# Patient Record
Sex: Male | Born: 1980 | Race: White | Hispanic: No | Marital: Single | Smoking: Never smoker
Health system: Southern US, Community
[De-identification: ages and names within clinical notes are randomized; demographics above are authoritative.]

## PROBLEM LIST (undated history)

## (undated) DIAGNOSIS — M1990 Unspecified osteoarthritis, unspecified site: Secondary | ICD-10-CM

## (undated) DIAGNOSIS — F419 Anxiety disorder, unspecified: Secondary | ICD-10-CM

## (undated) DIAGNOSIS — F329 Major depressive disorder, single episode, unspecified: Secondary | ICD-10-CM

## (undated) DIAGNOSIS — F32A Depression, unspecified: Secondary | ICD-10-CM

## (undated) DIAGNOSIS — K219 Gastro-esophageal reflux disease without esophagitis: Secondary | ICD-10-CM

## (undated) HISTORY — DX: Depression, unspecified: F32.A

## (undated) HISTORY — DX: Major depressive disorder, single episode, unspecified: F32.9

## (undated) HISTORY — DX: Anxiety disorder, unspecified: F41.9

---

## 2006-11-08 HISTORY — PX: HAND SURGERY: SHX662

## 2008-03-18 ENCOUNTER — Ambulatory Visit (HOSPITAL_COMMUNITY): Admission: RE | Admit: 2008-03-18 | Discharge: 2008-03-18 | Payer: Self-pay | Admitting: Family Medicine

## 2008-04-02 ENCOUNTER — Ambulatory Visit (HOSPITAL_COMMUNITY): Admission: RE | Admit: 2008-04-02 | Discharge: 2008-04-02 | Payer: Self-pay | Admitting: Family Medicine

## 2017-12-06 ENCOUNTER — Encounter: Payer: Self-pay | Admitting: Gastroenterology

## 2017-12-27 ENCOUNTER — Emergency Department (HOSPITAL_COMMUNITY): Payer: Medicaid - Out of State

## 2017-12-27 ENCOUNTER — Emergency Department (HOSPITAL_COMMUNITY)
Admission: EM | Admit: 2017-12-27 | Discharge: 2017-12-27 | Disposition: A | Discharge status: Home / Self Care | Payer: Medicaid - Out of State | Attending: Emergency Medicine | Admitting: Emergency Medicine

## 2017-12-27 ENCOUNTER — Encounter (HOSPITAL_COMMUNITY): Payer: Self-pay | Admitting: Emergency Medicine

## 2017-12-27 DIAGNOSIS — R0789 Other chest pain: Secondary | ICD-10-CM | POA: Diagnosis not present

## 2017-12-27 DIAGNOSIS — Y9389 Activity, other specified: Secondary | ICD-10-CM | POA: Diagnosis not present

## 2017-12-27 DIAGNOSIS — R51 Headache: Secondary | ICD-10-CM | POA: Insufficient documentation

## 2017-12-27 DIAGNOSIS — R109 Unspecified abdominal pain: Secondary | ICD-10-CM | POA: Insufficient documentation

## 2017-12-27 DIAGNOSIS — M542 Cervicalgia: Secondary | ICD-10-CM | POA: Insufficient documentation

## 2017-12-27 DIAGNOSIS — M7918 Myalgia, other site: Secondary | ICD-10-CM | POA: Diagnosis not present

## 2017-12-27 DIAGNOSIS — R079 Chest pain, unspecified: Secondary | ICD-10-CM | POA: Diagnosis present

## 2017-12-27 DIAGNOSIS — R55 Syncope and collapse: Secondary | ICD-10-CM | POA: Diagnosis not present

## 2017-12-27 DIAGNOSIS — Y998 Other external cause status: Secondary | ICD-10-CM | POA: Diagnosis not present

## 2017-12-27 DIAGNOSIS — Y92410 Unspecified street and highway as the place of occurrence of the external cause: Secondary | ICD-10-CM | POA: Insufficient documentation

## 2017-12-27 LAB — COMPREHENSIVE METABOLIC PANEL
ALT: 40 U/L (ref 17–63)
AST: 24 U/L (ref 15–41)
Albumin: 4 g/dL (ref 3.5–5.0)
Alkaline Phosphatase: 55 U/L (ref 38–126)
Anion gap: 12 (ref 5–15)
BILIRUBIN TOTAL: 0.4 mg/dL (ref 0.3–1.2)
BUN: 11 mg/dL (ref 6–20)
CALCIUM: 9.5 mg/dL (ref 8.9–10.3)
CHLORIDE: 106 mmol/L (ref 101–111)
CO2: 24 mmol/L (ref 22–32)
CREATININE: 0.9 mg/dL (ref 0.61–1.24)
Glucose, Bld: 120 mg/dL — ABNORMAL HIGH (ref 65–99)
Potassium: 3.4 mmol/L — ABNORMAL LOW (ref 3.5–5.1)
Sodium: 142 mmol/L (ref 135–145)
TOTAL PROTEIN: 7.7 g/dL (ref 6.5–8.1)

## 2017-12-27 LAB — TYPE AND SCREEN
ABO/RH(D): B POS
ANTIBODY SCREEN: NEGATIVE

## 2017-12-27 LAB — CBC WITH DIFFERENTIAL/PLATELET
Basophils Absolute: 0 10*3/uL (ref 0.0–0.1)
Basophils Relative: 0 %
EOS PCT: 0 %
Eosinophils Absolute: 0 10*3/uL (ref 0.0–0.7)
HEMATOCRIT: 45.6 % (ref 39.0–52.0)
Hemoglobin: 15 g/dL (ref 13.0–17.0)
LYMPHS ABS: 1.5 10*3/uL (ref 0.7–4.0)
LYMPHS PCT: 11 %
MCH: 28 pg (ref 26.0–34.0)
MCHC: 32.9 g/dL (ref 30.0–36.0)
MCV: 85.1 fL (ref 78.0–100.0)
MONO ABS: 0.7 10*3/uL (ref 0.1–1.0)
Monocytes Relative: 5 %
NEUTROS ABS: 11.4 10*3/uL — AB (ref 1.7–7.7)
Neutrophils Relative %: 84 %
Platelets: 256 10*3/uL (ref 150–400)
RBC: 5.36 MIL/uL (ref 4.22–5.81)
RDW: 14.4 % (ref 11.5–15.5)
WBC: 13.7 10*3/uL — AB (ref 4.0–10.5)

## 2017-12-27 LAB — URINALYSIS, ROUTINE W REFLEX MICROSCOPIC
Bacteria, UA: NONE SEEN
Bilirubin Urine: NEGATIVE
Glucose, UA: NEGATIVE mg/dL
Ketones, ur: NEGATIVE mg/dL
Leukocytes, UA: NEGATIVE
NITRITE: NEGATIVE
PH: 7 (ref 5.0–8.0)
Protein, ur: NEGATIVE mg/dL
SQUAMOUS EPITHELIAL / LPF: NONE SEEN
Specific Gravity, Urine: 1.005 (ref 1.005–1.030)
WBC UA: NONE SEEN WBC/hpf (ref 0–5)

## 2017-12-27 MED ORDER — HYDROCODONE-ACETAMINOPHEN 5-325 MG PO TABS
1.0000 | ORAL_TABLET | Freq: Once | ORAL | Status: AC
  Administered 2017-12-27: 1 via ORAL
  Filled 2017-12-27: qty 1

## 2017-12-27 MED ORDER — HYDROCODONE-ACETAMINOPHEN 5-325 MG PO TABS: 1.0000 | ORAL_TABLET | ORAL | 0 refills | Status: DC | PRN

## 2017-12-27 MED ORDER — IBUPROFEN 800 MG PO TABS: 800.0000 mg | ORAL_TABLET | Freq: Three times a day (TID) | ORAL | 0 refills | Status: DC

## 2017-12-27 MED ORDER — CYCLOBENZAPRINE HCL 5 MG PO TABS: 5.0000 mg | ORAL_TABLET | Freq: Three times a day (TID) | ORAL | 0 refills | Status: DC | PRN

## 2017-12-27 MED ORDER — KETOROLAC TROMETHAMINE 30 MG/ML IJ SOLN
30.0000 mg | Freq: Once | INTRAMUSCULAR | Status: AC
  Administered 2017-12-27: 30 mg via INTRAVENOUS
  Filled 2017-12-27: qty 1

## 2017-12-27 MED ORDER — IOPAMIDOL (ISOVUE-300) INJECTION 61%
100.0000 mL | Freq: Once | INTRAVENOUS | Status: AC | PRN
  Administered 2017-12-27: 100 mL via INTRAVENOUS
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
## 2017-12-27 NOTE — ED Notes (Signed)
Pt stated he needs to urinate badly. Attempted to help pt x 2 and ran water but pt states it will not come out.
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
## 2018-01-20 ENCOUNTER — Telehealth: Payer: Self-pay | Admitting: *Deleted

## 2018-01-20 ENCOUNTER — Other Ambulatory Visit: Payer: Self-pay | Admitting: *Deleted

## 2018-01-20 ENCOUNTER — Encounter: Payer: Self-pay | Admitting: Gastroenterology

## 2018-01-20 ENCOUNTER — Encounter: Payer: Self-pay | Admitting: *Deleted

## 2018-01-20 ENCOUNTER — Ambulatory Visit (INDEPENDENT_AMBULATORY_CARE_PROVIDER_SITE_OTHER): Payer: Medicaid - Out of State | Admitting: Gastroenterology

## 2018-01-20 VITALS — BP 135/87 | HR 82 | Temp 97.2°F | Ht 67.5 in | Wt 173.6 lb

## 2018-01-20 DIAGNOSIS — R1030 Lower abdominal pain, unspecified: Secondary | ICD-10-CM | POA: Diagnosis not present

## 2018-01-20 DIAGNOSIS — R634 Abnormal weight loss: Secondary | ICD-10-CM | POA: Diagnosis not present

## 2018-01-20 DIAGNOSIS — K529 Noninfective gastroenteritis and colitis, unspecified: Secondary | ICD-10-CM | POA: Diagnosis not present

## 2018-01-20 DIAGNOSIS — K625 Hemorrhage of anus and rectum: Secondary | ICD-10-CM

## 2018-01-20 DIAGNOSIS — K920 Hematemesis: Secondary | ICD-10-CM

## 2018-01-20 DIAGNOSIS — R109 Unspecified abdominal pain: Secondary | ICD-10-CM | POA: Insufficient documentation

## 2018-01-20 MED ORDER — NA SULFATE-K SULFATE-MG SULF 17.5-3.13-1.6 GM/177ML PO SOLN: 1.0000 | ORAL | 0 refills | Status: AC

## 2018-01-20 MED ORDER — PANTOPRAZOLE SODIUM 40 MG PO TBEC: 40.0000 mg | DELAYED_RELEASE_TABLET | Freq: Every day | ORAL | 5 refills | Status: AC

## 2018-01-20 MED ORDER — PEG 3350-KCL-NA BICARB-NACL 420 G PO SOLR: 4000.0000 mL | Freq: Once | ORAL | 0 refills | Status: AC
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
## 2018-01-20 NOTE — Patient Instructions (Signed)
1. Start pantoprazole for acid reflux, possible gastritis or ulcer. Take one daily before breakfast.  2. Upper endoscopy and colonoscopy as scheduled. See separate instructions.

## 2018-01-20 NOTE — Telephone Encounter (Signed)
Received fax from the pharmacy suprep not covered. [email or web address] sent in and patient aware new instructions mailed to him (confirmed mailing address). Patient also aware of pre-op scheduled for 02/15/18 at 12:45pm. Letter also mailed.

## 2018-01-23 NOTE — Progress Notes (Signed)
CC'D TO PCP °

## 2018-02-14 NOTE — Patient Instructions (Signed)
Richard Flores  02/14/2018     @PREFPERIOPPHARMACY @   Your procedure is scheduled on  02/20/2018 .  Report to [facility] at  1000   A.M.  Call this number if you have problems the morning of surgery:  [phone]   Remember:  Do not eat food or drink liquids after midnight.  Take these medicines the morning of surgery with A SIP OF WATER  protonix.   Do not wear jewelry, make-up or nail polish.  Do not wear lotions, powders, or perfumes, or deodorant.  Do not shave 48 hours prior to surgery.  Men may shave face and neck.  Do not bring valuables to the hospital.  [facility] is not responsible for any belongings or valuables.  Contacts, dentures or bridgework may not be worn into surgery.  Leave your suitcase in the car.  After surgery it may be brought to your room.  For patients admitted to the hospital, discharge time will be determined by your treatment team.  Patients discharged the day of surgery will not be allowed to drive home.   Name and phone number of your driver:   family Special instructions:  Follow the diet and prep instructions given to you by Dr Roseanne Kaufman office.  Please read over the following fact sheets that you were given. Anesthesia Post-op Instructions and Care and Recovery After Surgery       Esophagogastroduodenoscopy Esophagogastroduodenoscopy (EGD) is a procedure to examine the lining of the esophagus, stomach, and first part of the small intestine (duodenum). This procedure is done to check for problems such as inflammation, bleeding, ulcers, or growths. During this procedure, a long, flexible, lighted tube with a camera attached (endoscope) is inserted down the throat. Tell a health care provider about:  Any allergies you have.  All medicines you are taking, including vitamins, herbs, eye drops, creams, and over-the-counter medicines.  Any problems you or family members have had with anesthetic medicines.  Any blood  disorders you have.  Any surgeries you have had.  Any medical conditions you have.  Whether you are pregnant or may be pregnant. What are the risks? Generally, this is a safe procedure. However, problems may occur, including:  Infection.  Bleeding.  A tear (perforation) in the esophagus, stomach, or duodenum.  Trouble breathing.  Excessive sweating.  Spasms of the larynx.  A slowed heartbeat.  Low blood pressure.  What happens before the procedure?  Follow instructions from your health care provider about eating or drinking restrictions.  Ask your health care provider about: ? Changing or stopping your regular medicines. This is especially important if you are taking diabetes medicines or blood thinners. ? Taking medicines such as aspirin and ibuprofen. These medicines can thin your blood. Do not take these medicines before your procedure if your health care provider instructs you not to.  Plan to have someone take you home after the procedure.  If you wear dentures, be ready to remove them before the procedure. What happens during the procedure?  To reduce your risk of infection, your health care team will wash or sanitize their hands.  An IV tube will be put in a vein in your hand or arm. You will get medicines and fluids through this tube.  You will be given one or more of the following: ? A medicine to help you relax (sedative). ? A medicine to numb the area (local anesthetic). This  medicine may be sprayed into your throat. It will make you feel more comfortable and keep you from gagging or coughing during the procedure. ? A medicine for pain.  A mouth guard may be placed in your mouth to protect your teeth and to keep you from biting on the endoscope.  You will be asked to lie on your left side.  The endoscope will be lowered down your throat into your esophagus, stomach, and duodenum.  Air will be put into the endoscope. This will help your health care  provider see better.  The lining of your esophagus, stomach, and duodenum will be examined.  Your health care provider may: ? Take a tissue sample so it can be looked at in a lab (biopsy). ? Remove growths. ? Remove objects (foreign bodies) that are stuck. ? Treat any bleeding with medicines or other devices that stop tissue from bleeding. ? Widen (dilate) or stretch narrowed areas of your esophagus and stomach.  The endoscope will be taken out. The procedure may vary among health care providers and hospitals. What happens after the procedure?  Your blood pressure, heart rate, breathing rate, and blood oxygen level will be monitored often until the medicines you were given have worn off.  Do not eat or drink anything until the numbing medicine has worn off and your gag reflex has returned. This information is not intended to replace advice given to you by your health care provider. Make sure you discuss any questions you have with your health care provider. Document Released: 02/25/2005 Document Revised: 04/01/2016 Document Reviewed: 09/18/2015 Elsevier Interactive Patient Education  2018 [employer]. Esophagogastroduodenoscopy, Care After Refer to this sheet in the next few weeks. These instructions provide you with information about caring for yourself after your procedure. Your health care provider may also give you more specific instructions. Your treatment has been planned according to current medical practices, but problems sometimes occur. Call your health care provider if you have any problems or questions after your procedure. What can I expect after the procedure? After the procedure, it is common to have:  A sore throat.  Nausea.  Bloating.  Dizziness.  Fatigue.  Follow these instructions at home:  Do not eat or drink anything until the numbing medicine (local anesthetic) has worn off and your gag reflex has returned. You will know that the local anesthetic has worn  off when you can swallow comfortably.  Do not drive for 24 hours if you received a medicine to help you relax (sedative).  If your health care provider took a tissue sample for testing during the procedure, make sure to get your test results. This is your responsibility. Ask your health care provider or the department performing the test when your results will be ready.  Keep all follow-up visits as told by your health care provider. This is important. Contact a health care provider if:  You cannot stop coughing.  You are not urinating.  You are urinating less than usual. Get help right away if:  You have trouble swallowing.  You cannot eat or drink.  You have throat or chest pain that gets worse.  You are dizzy or light-headed.  You faint.  You have nausea or vomiting.  You have chills.  You have a fever.  You have severe abdominal pain.  You have black, tarry, or bloody stools. This information is not intended to replace advice given to you by your health care provider. Make sure you discuss any questions you  have with your health care provider. Document Released: 10/11/2012 Document Revised: 04/01/2016 Document Reviewed: 09/18/2015 Elsevier Interactive Patient Education  2018 [employer].  Colonoscopy, Adult A colonoscopy is an exam to look at the large intestine. It is done to check for problems, such as:  Lumps (tumors).  Growths (polyps).  Swelling (inflammation).  Bleeding.  What happens before the procedure? Eating and drinking Follow instructions from your doctor about eating and drinking. These instructions may include:  A few days before the procedure - follow a low-fiber diet. ? Avoid nuts. ? Avoid seeds. ? Avoid dried fruit. ? Avoid raw fruits. ? Avoid vegetables.  1-3 days before the procedure - follow a clear liquid diet. Avoid liquids that have red or purple dye. Drink only clear liquids, such as: ? Clear broth or bouillon. ? Black  coffee or tea. ? Clear juice. ? Clear soft drinks or sports drinks. ? Gelatin dessert. ? Popsicles.  On the day of the procedure - do not eat or drink anything during the 2 hours before the procedure.  Bowel prep If you were prescribed an oral bowel prep:  Take it as told by your doctor. Starting the day before your procedure, you will need to drink a lot of liquid. The liquid will cause you to poop (have bowel movements) until your poop is almost clear or light green.  If your skin or butt gets irritated from diarrhea, you may: ? Wipe the area with wipes that have medicine in them, such as adult wet wipes with aloe and vitamin E. ? Put something on your skin that soothes the area, such as petroleum jelly.  If you throw up (vomit) while drinking the bowel prep, take a break for up to 60 minutes. Then begin the bowel prep again. If you keep throwing up and you cannot take the bowel prep without throwing up, call your doctor.  General instructions  Ask your doctor about changing or stopping your normal medicines. This is important if you take diabetes medicines or blood thinners.  Plan to have someone take you home from the hospital or clinic. What happens during the procedure?  An IV tube may be put into one of your veins.  You will be given medicine to help you relax (sedative).  To reduce your risk of infection: ? Your doctors will wash their hands. ? Your anal area will be washed with soap.  You will be asked to lie on your side with your knees bent.  Your doctor will get a long, thin, flexible tube ready. The tube will have a camera and a light on the end.  The tube will be put into your anus.  The tube will be gently put into your large intestine.  Air will be delivered into your large intestine to keep it open. You may feel some pressure or cramping.  The camera will be used to take photos.  A small tissue sample may be removed from your body to be looked at under a  microscope (biopsy). If any possible problems are found, the tissue will be sent to a lab for testing.  If small growths are found, your doctor may remove them and have them checked for cancer.  The tube that was put into your anus will be slowly removed. The procedure may vary among doctors and hospitals. What happens after the procedure?  Your doctor will check on you often until the medicines you were given have worn off.  Do not drive for  24 hours after the procedure.  You may have a small amount of blood in your poop.  You may pass gas.  You may have mild cramps or bloating in your belly (abdomen).  It is up to you to get the results of your procedure. Ask your doctor, or the department performing the procedure, when your results will be ready. This information is not intended to replace advice given to you by your health care provider. Make sure you discuss any questions you have with your health care provider. Document Released: 11/27/2010 Document Revised: 08/25/2016 Document Reviewed: 01/06/2016 Elsevier Interactive Patient Education  2017 [employer]  Colonoscopy, Adult, Care After This sheet gives you information about how to care for yourself after your procedure. Your health care provider may also give you more specific instructions. If you have problems or questions, contact your health care provider. What can I expect after the procedure? After the procedure, it is common to have:  A small amount of blood in your stool for 24 hours after the procedure.  Some gas.  Mild abdominal cramping or bloating.  Follow these instructions at home: General instructions   For the first 24 hours after the procedure: ? Do not drive or use machinery. ? Do not sign important documents. ? Do not drink alcohol. ? Do your regular daily activities at a slower pace than normal. ? Eat soft, easy-to-digest foods. ? Rest often.  Take over-the-counter or prescription medicines  only as told by your health care provider.  It is up to you to get the results of your procedure. Ask your health care provider, or the department performing the procedure, when your results will be ready. Relieving cramping and bloating  Try walking around when you have cramps or feel bloated.  Apply heat to your abdomen as told by your health care provider. Use a heat source that your health care provider recommends, such as a moist heat pack or a heating pad. ? Place a towel between your skin and the heat source. ? Leave the heat on for 20-30 minutes. ? Remove the heat if your skin turns bright red. This is especially important if you are unable to feel pain, heat, or cold. You may have a greater risk of getting burned. Eating and drinking  Drink enough fluid to keep your urine clear or pale yellow.  Resume your normal diet as instructed by your health care provider. Avoid heavy or fried foods that are hard to digest.  Avoid drinking alcohol for as long as instructed by your health care provider. Contact a health care provider if:  You have blood in your stool 2-3 days after the procedure. Get help right away if:  You have more than a small spotting of blood in your stool.  You pass large blood clots in your stool.  Your abdomen is swollen.  You have nausea or vomiting.  You have a fever.  You have increasing abdominal pain that is not relieved with medicine. This information is not intended to replace advice given to you by your health care provider. Make sure you discuss any questions you have with your health care provider. Document Released: 06/08/2004 Document Revised: 07/19/2016 Document Reviewed: 01/06/2016 Elsevier Interactive Patient Education  2018 [place] Anesthesia is a term that refers to techniques, procedures, and medicines that help a person stay safe and comfortable during a medical procedure. Monitored anesthesia care, or  sedation, is one type of anesthesia. Your anesthesia specialist  may recommend sedation if you will be having a procedure that does not require you to be unconscious, such as:  Cataract surgery.  A dental procedure.  A biopsy.  A colonoscopy.  During the procedure, you may receive a medicine to help you relax (sedative). There are three levels of sedation:  Mild sedation. At this level, you may feel awake and relaxed. You will be able to follow directions.  Moderate sedation. At this level, you will be sleepy. You may not remember the procedure.  Deep sedation. At this level, you will be asleep. You will not remember the procedure.  The more medicine you are given, the deeper your level of sedation will be. Depending on how you respond to the procedure, the anesthesia specialist may change your level of sedation or the type of anesthesia to fit your needs. An anesthesia specialist will monitor you closely during the procedure. Let your health care provider know about:  Any allergies you have.  All medicines you are taking, including vitamins, herbs, eye drops, creams, and over-the-counter medicines.  Any use of steroids (by mouth or as a cream).  Any problems you or family members have had with sedatives and anesthetic medicines.  Any blood disorders you have.  Any surgeries you have had.  Any medical conditions you have, such as sleep apnea.  Whether you are pregnant or may be pregnant.  Any use of cigarettes, alcohol, or street drugs. What are the risks? Generally, this is a safe procedure. However, problems may occur, including:  Getting too much medicine (oversedation).  Nausea.  Allergic reaction to medicines.  Trouble breathing. If this happens, a breathing tube may be used to help with breathing. It will be removed when you are awake and breathing on your own.  Heart trouble.  Lung trouble.  Before the procedure Staying hydrated Follow instructions from  your health care provider about hydration, which may include:  Up to 2 hours before the procedure - you may continue to drink clear liquids, such as water, clear fruit juice, black coffee, and plain tea.  Eating and drinking restrictions Follow instructions from your health care provider about eating and drinking, which may include:  8 hours before the procedure - stop eating heavy meals or foods such as meat, fried foods, or fatty foods.  6 hours before the procedure - stop eating light meals or foods, such as toast or cereal.  6 hours before the procedure - stop drinking milk or drinks that contain milk.  2 hours before the procedure - stop drinking clear liquids.  Medicines Ask your health care provider about:  Changing or stopping your regular medicines. This is especially important if you are taking diabetes medicines or blood thinners.  Taking medicines such as aspirin and ibuprofen. These medicines can thin your blood. Do not take these medicines before your procedure if your health care provider instructs you not to.  Tests and exams  You will have a physical exam.  You may have blood tests done to show: ? How well your kidneys and liver are working. ? How well your blood can clot.  General instructions  Plan to have someone take you home from the hospital or clinic.  If you will be going home right after the procedure, plan to have someone with you for 24 hours.  What happens during the procedure?  Your blood pressure, heart rate, breathing, level of pain and overall condition will be monitored.  An IV tube will be inserted  into one of your veins.  Your anesthesia specialist will give you medicines as needed to keep you comfortable during the procedure. This may mean changing the level of sedation.  The procedure will be performed. After the procedure  Your blood pressure, heart rate, breathing rate, and blood oxygen level will be monitored until the medicines  you were given have worn off.  Do not drive for 24 hours if you received a sedative.  You may: ? Feel sleepy, clumsy, or nauseous. ? Feel forgetful about what happened after the procedure. ? Have a sore throat if you had a breathing tube during the procedure. ? Vomit. This information is not intended to replace advice given to you by your health care provider. Make sure you discuss any questions you have with your health care provider. Document Released: 07/21/2005 Document Revised: 04/02/2016 Document Reviewed: 02/15/2016 Elsevier Interactive Patient Education  2018 [place], Care After These instructions provide you with information about caring for yourself after your procedure. Your health care provider may also give you more specific instructions. Your treatment has been planned according to current medical practices, but problems sometimes occur. Call your health care provider if you have any problems or questions after your procedure. What can I expect after the procedure? After your procedure, it is common to:  Feel sleepy for several hours.  Feel clumsy and have poor balance for several hours.  Feel forgetful about what happened after the procedure.  Have poor judgment for several hours.  Feel nauseous or vomit.  Have a sore throat if you had a breathing tube during the procedure.  Follow these instructions at home: For at least 24 hours after the procedure:   Do not: ? Participate in activities in which you could fall or become injured. ? Drive. ? Use heavy machinery. ? Drink alcohol. ? Take sleeping pills or medicines that cause drowsiness. ? Make important decisions or sign legal documents. ? Take care of children on your own.  Rest. Eating and drinking  Follow the diet that is recommended by your health care provider.  If you vomit, drink water, juice, or soup when you can drink without vomiting.  Make sure you have little  or no nausea before eating solid foods. General instructions  Have a responsible adult stay with you until you are awake and alert.  Take over-the-counter and prescription medicines only as told by your health care provider.  If you smoke, do not smoke without supervision.  Keep all follow-up visits as told by your health care provider. This is important. Contact a health care provider if:  You keep feeling nauseous or you keep vomiting.  You feel light-headed.  You develop a rash.  You have a fever. Get help right away if:  You have trouble breathing. This information is not intended to replace advice given to you by your health care provider. Make sure you discuss any questions you have with your health care provider. Document Released: 02/15/2016 Document Revised: 06/16/2016 Document Reviewed: 02/15/2016 Elsevier Interactive Patient Education  [employer].

## 2018-02-15 ENCOUNTER — Other Ambulatory Visit: Payer: Self-pay

## 2018-02-15 ENCOUNTER — Encounter (HOSPITAL_COMMUNITY)
Admission: RE | Admit: 2018-02-15 | Discharge: 2018-02-15 | Disposition: A | Discharge status: Home / Self Care | Payer: Medicaid - Out of State | Source: Ambulatory Visit | Attending: Internal Medicine | Admitting: Internal Medicine

## 2018-02-15 ENCOUNTER — Encounter (HOSPITAL_COMMUNITY): Payer: Self-pay

## 2018-02-15 DIAGNOSIS — Z01812 Encounter for preprocedural laboratory examination: Secondary | ICD-10-CM | POA: Diagnosis not present

## 2018-02-15 HISTORY — DX: Gastro-esophageal reflux disease without esophagitis: K21.9

## 2018-02-15 HISTORY — DX: Unspecified osteoarthritis, unspecified site: M19.90

## 2018-02-15 LAB — BASIC METABOLIC PANEL
ANION GAP: 13 (ref 5–15)
BUN: 15 mg/dL (ref 6–20)
CHLORIDE: 103 mmol/L (ref 101–111)
CO2: 24 mmol/L (ref 22–32)
Calcium: 9.8 mg/dL (ref 8.9–10.3)
Creatinine, Ser: 1 mg/dL (ref 0.61–1.24)
GFR calc non Af Amer: >60 mL/min (ref >60)
Glucose, Bld: 121 mg/dL — ABNORMAL HIGH (ref 65–99)
POTASSIUM: 3.9 mmol/L (ref 3.5–5.1)
Sodium: 140 mmol/L (ref 135–145)

## 2018-02-15 LAB — CBC WITH DIFFERENTIAL/PLATELET
BASOS ABS: 0 10*3/uL (ref 0.0–0.1)
BASOS PCT: 0 %
EOS ABS: 0.1 10*3/uL (ref 0.0–0.7)
EOS PCT: 1 %
HCT: 46.8 % (ref 39.0–52.0)
HEMOGLOBIN: 15.6 g/dL (ref 13.0–17.0)
Lymphocytes Relative: 17 %
Lymphs Abs: 1.4 10*3/uL (ref 0.7–4.0)
MCH: 28.3 pg (ref 26.0–34.0)
MCHC: 33.3 g/dL (ref 30.0–36.0)
MCV: 84.9 fL (ref 78.0–100.0)
Monocytes Absolute: 0.5 10*3/uL (ref 0.1–1.0)
Monocytes Relative: 6 %
NEUTROS PCT: 76 %
Neutro Abs: 6.3 10*3/uL (ref 1.7–7.7)
PLATELETS: 209 10*3/uL (ref 150–400)
RBC: 5.51 MIL/uL (ref 4.22–5.81)
RDW: 13.7 % (ref 11.5–15.5)
WBC: 8.2 10*3/uL (ref 4.0–10.5)

## 2018-02-20 ENCOUNTER — Ambulatory Visit (HOSPITAL_COMMUNITY)
Admission: RE | Admit: 2018-02-20 | Discharge: 2018-02-20 | Disposition: A | Discharge status: Home / Self Care | Payer: Medicaid - Out of State | Source: Ambulatory Visit | Attending: Internal Medicine | Admitting: Internal Medicine

## 2018-02-20 ENCOUNTER — Encounter (HOSPITAL_COMMUNITY)
Admission: RE | Disposition: A | Discharge status: Home / Self Care | Payer: Self-pay | Source: Ambulatory Visit | Attending: Internal Medicine

## 2018-02-20 ENCOUNTER — Other Ambulatory Visit: Payer: Self-pay

## 2018-02-20 ENCOUNTER — Ambulatory Visit (HOSPITAL_COMMUNITY): Payer: Medicaid - Out of State | Admitting: Anesthesiology

## 2018-02-20 ENCOUNTER — Encounter (HOSPITAL_COMMUNITY): Payer: Self-pay | Admitting: Anesthesiology

## 2018-02-20 DIAGNOSIS — K449 Diaphragmatic hernia without obstruction or gangrene: Secondary | ICD-10-CM | POA: Insufficient documentation

## 2018-02-20 DIAGNOSIS — R1310 Dysphagia, unspecified: Secondary | ICD-10-CM | POA: Diagnosis not present

## 2018-02-20 DIAGNOSIS — K921 Melena: Secondary | ICD-10-CM | POA: Diagnosis not present

## 2018-02-20 DIAGNOSIS — K529 Noninfective gastroenteritis and colitis, unspecified: Secondary | ICD-10-CM | POA: Diagnosis not present

## 2018-02-20 DIAGNOSIS — R112 Nausea with vomiting, unspecified: Secondary | ICD-10-CM | POA: Diagnosis not present

## 2018-02-20 DIAGNOSIS — K319 Disease of stomach and duodenum, unspecified: Secondary | ICD-10-CM | POA: Insufficient documentation

## 2018-02-20 DIAGNOSIS — R1013 Epigastric pain: Secondary | ICD-10-CM

## 2018-02-20 DIAGNOSIS — K635 Polyp of colon: Secondary | ICD-10-CM | POA: Insufficient documentation

## 2018-02-20 DIAGNOSIS — K640 First degree hemorrhoids: Secondary | ICD-10-CM | POA: Diagnosis not present

## 2018-02-20 DIAGNOSIS — K269 Duodenal ulcer, unspecified as acute or chronic, without hemorrhage or perforation: Secondary | ICD-10-CM | POA: Insufficient documentation

## 2018-02-20 DIAGNOSIS — Z79899 Other long term (current) drug therapy: Secondary | ICD-10-CM | POA: Diagnosis not present

## 2018-02-20 DIAGNOSIS — D120 Benign neoplasm of cecum: Secondary | ICD-10-CM

## 2018-02-20 DIAGNOSIS — K219 Gastro-esophageal reflux disease without esophagitis: Secondary | ICD-10-CM | POA: Insufficient documentation

## 2018-02-20 HISTORY — PX: BIOPSY: SHX5522

## 2018-02-20 HISTORY — PX: POLYPECTOMY: SHX5525

## 2018-02-20 HISTORY — PX: ESOPHAGOGASTRODUODENOSCOPY (EGD) WITH PROPOFOL: SHX5813

## 2018-02-20 HISTORY — PX: COLONOSCOPY WITH PROPOFOL: SHX5780

## 2018-02-20 SURGERY — COLONOSCOPY WITH PROPOFOL
Anesthesia: General

## 2018-02-20 MED ORDER — PROPOFOL 500 MG/50ML IV EMUL
INTRAVENOUS | Status: DC | PRN
  Administered 2018-02-20: 175 ug/kg/min via INTRAVENOUS
  Administered 2018-02-20 (×3): via INTRAVENOUS

## 2018-02-20 MED ORDER — PROMETHAZINE HCL 25 MG/ML IJ SOLN: 6.2500 mg | INTRAMUSCULAR | Status: DC | PRN

## 2018-02-20 MED ORDER — PROPOFOL 10 MG/ML IV BOLUS
INTRAVENOUS | Status: AC
  Filled 2018-02-20: qty 40

## 2018-02-20 MED ORDER — LACTATED RINGERS IV SOLN
INTRAVENOUS | Status: DC
  Administered 2018-02-20: 12:00:00 via INTRAVENOUS

## 2018-02-20 MED ORDER — LIDOCAINE VISCOUS 2 % MT SOLN
OROMUCOSAL | Status: AC
  Filled 2018-02-20: qty 15

## 2018-02-20 MED ORDER — MIDAZOLAM HCL 2 MG/2ML IJ SOLN: 0.5000 mg | Freq: Once | INTRAMUSCULAR | Status: DC | PRN

## 2018-02-20 MED ORDER — MIDAZOLAM HCL 2 MG/2ML IJ SOLN
INTRAMUSCULAR | Status: AC
  Filled 2018-02-20: qty 2

## 2018-02-20 MED ORDER — HYDROMORPHONE HCL 1 MG/ML IJ SOLN: 0.2500 mg | INTRAMUSCULAR | Status: DC | PRN

## 2018-02-20 MED ORDER — ACETAMINOPHEN 10 MG/ML IV SOLN: 1000.0000 mg | Freq: Once | INTRAVENOUS | Status: DC | PRN

## 2018-02-20 MED ORDER — PROPOFOL 10 MG/ML IV BOLUS
INTRAVENOUS | Status: DC | PRN
  Administered 2018-02-20 (×4): 20 mg via INTRAVENOUS

## 2018-02-20 MED ORDER — LIDOCAINE VISCOUS 2 % MT SOLN
3.0000 mL | Freq: Two times a day (BID) | OROMUCOSAL | Status: DC
  Administered 2018-02-20: 3 mL via OROMUCOSAL

## 2018-02-20 MED ORDER — MIDAZOLAM HCL 5 MG/5ML IJ SOLN
INTRAMUSCULAR | Status: DC | PRN
  Administered 2018-02-20: 2 mg via INTRAVENOUS

## 2018-02-20 NOTE — Transfer of Care (Signed)
Immediate Anesthesia Transfer of Care Note  Patient: Richard Flores  Procedure(s) Performed: COLONOSCOPY WITH PROPOFOL (N/A ) ESOPHAGOGASTRODUODENOSCOPY (EGD) WITH PROPOFOL (N/A ) BIOPSY POLYPECTOMY  Patient Location: PACU  Anesthesia Type:MAC  Level of Consciousness: awake and alert   Airway & Oxygen Therapy: Patient Spontanous Breathing  Post-op Assessment: Report given to RN  Post vital signs: Reviewed and stable  Last Vitals:  Vitals Value Taken Time  BP    Temp    Pulse 87 02/20/2018  2:12 PM  Resp 17 02/20/2018  2:12 PM  SpO2 98 % 02/20/2018  2:12 PM  Vitals shown include unvalidated device data.  Last Pain:  Vitals:   02/20/18 1114  TempSrc: Oral  PainSc: 0-No pain         Complications: No apparent anesthesia complications

## 2018-02-20 NOTE — Addendum Note (Signed)
Addendum  created 02/20/18 1428 by Ollen Bowl, CRNA   Sign clinical note

## 2018-02-20 NOTE — Anesthesia Preprocedure Evaluation (Addendum)
Anesthesia Evaluation  Patient identified by MRN, date of birth, ID band Patient awake    Reviewed: Allergy & Precautions, H&P , NPO status , Patient's Chart, lab work & pertinent test results, reviewed documented beta blocker date and time   Airway Mallampati: II  TM Distance: >3 FB Neck ROM: full    Dental no notable dental hx.    Pulmonary neg pulmonary ROS,    Pulmonary exam normal breath sounds clear to auscultation       Cardiovascular Exercise Tolerance: Good negative cardio ROS Normal cardiovascular exam Rhythm:regular Rate:Normal     Neuro/Psych negative neurological ROS  negative psych ROS   GI/Hepatic negative GI ROS, Neg liver ROS,   Endo/Other  negative endocrine ROS  Renal/GU negative Renal ROS  negative genitourinary   Musculoskeletal   Abdominal   Peds  Hematology negative hematology ROS (+)   Anesthesia Other Findings No clinical complaints  Reproductive/Obstetrics negative OB ROS                             Anesthesia Physical Anesthesia Plan  ASA: II  Anesthesia Plan: MAC   Post-op Pain Management:    Induction:   PONV Risk Score and Plan:   Airway Management Planned:   Additional Equipment:   Intra-op Plan:   Post-operative Plan:   Informed Consent: I have reviewed the patients History and Physical, chart, labs and discussed the procedure including the risks, benefits and alternatives for the proposed anesthesia with the patient or authorized representative who has indicated his/her understanding and acceptance.   Dental Advisory Given  Plan Discussed with: CRNA  Anesthesia Plan Comments:        Anesthesia Quick Evaluation

## 2018-02-20 NOTE — Discharge Instructions (Signed)
Colonoscopy Discharge Instructions  Read the instructions outlined below and refer to this sheet in the next few weeks. These discharge instructions provide you with general information on caring for yourself after you leave the hospital. Your doctor may also give you specific instructions. While your treatment has been planned according to the most current medical practices available, unavoidable complications occasionally occur. If you have any problems or questions after discharge, call Dr. Gala Romney at [phone]. ACTIVITY  You may resume your regular activity, but move at a slower pace for the next 24 hours.   Take frequent rest periods for the next 24 hours.   Walking will help get rid of the air and reduce the bloated feeling in your belly (abdomen).   No driving for 24 hours (because of the medicine (anesthesia) used during the test).    Do not sign any important legal documents or operate any machinery for 24 hours (because of the anesthesia used during the test).  NUTRITION  Drink plenty of fluids.   You may resume your normal diet as instructed by your doctor.   Begin with a light meal and progress to your normal diet. Heavy or fried foods are harder to digest and may make you feel sick to your stomach (nauseated).   Avoid alcoholic beverages for 24 hours or as instructed.  MEDICATIONS  You may resume your normal medications unless your doctor tells you otherwise.  WHAT YOU CAN EXPECT TODAY  Some feelings of bloating in the abdomen.   Passage of more gas than usual.   Spotting of blood in your stool or on the toilet paper.  IF YOU HAD POLYPS REMOVED DURING THE COLONOSCOPY:  No aspirin products for 7 days or as instructed.   No alcohol for 7 days or as instructed.   Eat a soft diet for the next 24 hours.  FINDING OUT THE RESULTS OF YOUR TEST Not all test results are available during your visit. If your test results are not back during the visit, make an appointment  with your caregiver to find out the results. Do not assume everything is normal if you have not heard from your caregiver or the medical facility. It is important for you to follow up on all of your test results.  SEEK IMMEDIATE MEDICAL ATTENTION IF:  You have more than a spotting of blood in your stool.   Your belly is swollen (abdominal distention).   You are nauseated or vomiting.   You have a temperature over 101.   You have abdominal pain or discomfort that is severe or gets worse throughout the day.    EGD Discharge instructions Please read the instructions outlined below and refer to this sheet in the next few weeks. These discharge instructions provide you with general information on caring for yourself after you leave the hospital. Your doctor may also give you specific instructions. While your treatment has been planned according to the most current medical practices available, unavoidable complications occasionally occur. If you have any problems or questions after discharge, please call your doctor. ACTIVITY  You may resume your regular activity but move at a slower pace for the next 24 hours.   Take frequent rest periods for the next 24 hours.   Walking will help expel (get rid of) the air and reduce the bloated feeling in your abdomen.   No driving for 24 hours (because of the anesthesia (medicine) used during the test).   You may shower.   Do not sign  any important legal documents or operate any machinery for 24 hours (because of the anesthesia used during the test).  NUTRITION  Drink plenty of fluids.   You may resume your normal diet.   Begin with a light meal and progress to your normal diet.   Avoid alcoholic beverages for 24 hours or as instructed by your caregiver.  MEDICATIONS  You may resume your normal medications unless your caregiver tells you otherwise.  WHAT YOU CAN EXPECT TODAY  You may experience abdominal discomfort such as a feeling of  fullness or gas pains.  FOLLOW-UP  Your doctor will discuss the results of your test with you.  SEEK IMMEDIATE MEDICAL ATTENTION IF ANY OF THE FOLLOWING OCCUR:  Excessive nausea (feeling sick to your stomach) and/or vomiting.   Severe abdominal pain and distention (swelling).   Trouble swallowing.   Temperature over 101 F (37.8 C).   Rectal bleeding or vomiting of blood.    Hemorrhoids Hemorrhoids are swollen veins in and around the rectum or anus. There are two types of hemorrhoids: Internal hemorrhoids. These occur in the veins that are just inside the rectum. They may poke through to the outside and become irritated and painful. External hemorrhoids. These occur in the veins that are outside of the anus and can be felt as a painful swelling or hard lump near the anus.  Most hemorrhoids do not cause serious problems, and they can be managed with home treatments such as diet and lifestyle changes. If home treatments do not help your symptoms, procedures can be done to shrink or remove the hemorrhoids. What are the causes? This condition is caused by increased pressure in the anal area. This pressure may result from various things, including: Constipation. Straining to have a bowel movement. Diarrhea. Pregnancy. Obesity. Sitting for long periods of time. Heavy lifting or other activity that causes you to strain. Anal sex.  What are the signs or symptoms? Symptoms of this condition include: Pain. Anal itching or irritation. Rectal bleeding. Leakage of stool (feces). Anal swelling. One or more lumps around the anus.  How is this diagnosed? This condition can often be diagnosed through a visual exam. Other exams or tests may also be done, such as: Examination of the rectal area with a gloved hand (digital rectal exam). Examination of the anal canal using a small tube (anoscope). A blood test, if you have lost a significant amount of blood. A test to look inside the  colon (sigmoidoscopy or colonoscopy).  How is this treated? This condition can usually be treated at home. However, various procedures may be done if dietary changes, lifestyle changes, and other home treatments do not help your symptoms. These procedures can help make the hemorrhoids smaller or remove them completely. Some of these procedures involve surgery, and others do not. Common procedures include: Rubber band ligation. Rubber bands are placed at the base of the hemorrhoids to cut off the blood supply to them. Sclerotherapy. Medicine is injected into the hemorrhoids to shrink them. Infrared coagulation. A type of light energy is used to get rid of the hemorrhoids. Hemorrhoidectomy surgery. The hemorrhoids are surgically removed, and the veins that supply them are tied off. Stapled hemorrhoidopexy surgery. A circular stapling device is used to remove the hemorrhoids and use staples to cut off the blood supply to them.  Follow these instructions at home: Eating and drinking Eat foods that have a lot of fiber in them, such as whole grains, beans, nuts, fruits, and vegetables. Ask  your health care provider about taking products that have added fiber (fiber supplements). Drink enough fluid to keep your urine clear or pale yellow. Managing pain and swelling Take warm sitz baths for 20 minutes, 3-4 times a day to ease pain and discomfort. If directed, apply ice to the affected area. Using ice packs between sitz baths may be helpful. Put ice in a plastic bag. Place a towel between your skin and the bag. Leave the ice on for 20 minutes, 2-3 times a day. General instructions Take over-the-counter and prescription medicines only as told by your health care provider. Use medicated creams or suppositories as told. Exercise regularly. Go to the bathroom when you have the urge to have a bowel movement. Do not wait. Avoid straining to have bowel movements. Keep the anal area dry and clean. Use wet  toilet paper or moist towelettes after a bowel movement. Do not sit on the toilet for long periods of time. This increases blood pooling and pain. Contact a health care provider if: You have increasing pain and swelling that are not controlled by treatment or medicine. You have uncontrolled bleeding. You have difficulty having a bowel movement, or you are unable to have a bowel movement. You have pain or inflammation outside the area of the hemorrhoids. This information is not intended to replace advice given to you by your health care provider. Make sure you discuss any questions you have with your health care provider. Document Released: 10/22/2000 Document Revised: 03/24/2016 Document Reviewed: 07/09/2015 Elsevier Interactive Patient Education  2018 [employer].   Colon Polyps Polyps are tissue growths inside the body. Polyps can grow in many places, including the large intestine (colon). A polyp may be a round bump or a mushroom-shaped growth. You could have one polyp or several. Most colon polyps are noncancerous (benign). However, some colon polyps can become cancerous over time. What are the causes? The exact cause of colon polyps is not known. What increases the risk? This condition is more likely to develop in people who:  Have a family history of colon cancer or colon polyps.  Are older than 34 or older than 45 if they are African American.  Have inflammatory bowel disease, such as ulcerative colitis or Crohn disease.  Are overweight.  Smoke cigarettes.  Do not get enough exercise.  Drink too much alcohol.  Eat a diet that is: ? High in fat and red meat. ? Low in fiber.  Had childhood cancer that was treated with abdominal radiation.  What are the signs or symptoms? Most polyps do not cause symptoms. If you have symptoms, they may include:  Blood coming from your rectum when having a bowel movement.  Blood in your stool.The stool may look dark red or  black.  A change in bowel habits, such as constipation or diarrhea.  How is this diagnosed? This condition is diagnosed with a colonoscopy. This is a procedure that uses a lighted, flexible scope to look at the inside of your colon. How is this treated? Treatment for this condition involves removing any polyps that are found. Those polyps will then be tested for cancer. If cancer is found, your health care provider will talk to you about options for colon cancer treatment. Follow these instructions at home: Diet  Eat plenty of fiber, such as fruits, vegetables, and whole grains.  Eat foods that are high in calcium and vitamin D, such as milk, cheese, yogurt, eggs, liver, fish, and broccoli.  Limit foods high in  fat, red meats, and processed meats, such as hot dogs, sausage, bacon, and lunch meats.  Maintain a healthy weight, or lose weight if recommended by your health care provider. General instructions  Do not smoke cigarettes.  Do not drink alcohol excessively.  Keep all follow-up visits as told by your health care provider. This is important. This includes keeping regularly scheduled colonoscopies. Talk to your health care provider about when you need a colonoscopy.  Exercise every day or as told by your health care provider. Contact a health care provider if:  You have new or worsening bleeding during a bowel movement.  You have new or increased blood in your stool.  You have a change in bowel habits.  You unexpectedly lose weight. This information is not intended to replace advice given to you by your health care provider. Make sure you discuss any questions you have with your health care provider. Document Released: 07/21/2004 Document Revised: 04/01/2016 Document Reviewed: 09/15/2015 Elsevier Interactive Patient Education  2018 [employer].   Hiatal Hernia A hiatal hernia occurs when part of the stomach slides above the muscle that separates the abdomen from the  chest (diaphragm). A person can be born with a hiatal hernia (congenital), or it may develop over time. In almost all cases of hiatal hernia, only the top part of the stomach pushes through the diaphragm. Many people have a hiatal hernia with no symptoms. The larger the hernia, the more likely it is that you will have symptoms. In some cases, a hiatal hernia allows stomach acid to flow back into the tube that carries food from your mouth to your stomach (esophagus). This may cause heartburn symptoms. Severe heartburn symptoms may mean that you have developed a condition called gastroesophageal reflux disease (GERD). What are the causes? This condition is caused by a weakness in the opening (hiatus) where the esophagus passes through the diaphragm to attach to the upper part of the stomach. A person may be born with a weakness in the hiatus, or a weakness can develop over time. What increases the risk? This condition is more likely to develop in:  Older people. Age is a major risk factor for a hiatal hernia, especially if you are over the age of 53.  Pregnant women.  People who are overweight.  People who have frequent constipation.  What are the signs or symptoms? Symptoms of this condition usually develop in the form of GERD symptoms. Symptoms include:  Heartburn.  Belching.  Indigestion.  Trouble swallowing.  Coughing or wheezing.  Sore throat.  Hoarseness.  Chest pain.  Nausea and vomiting.  How is this diagnosed? This condition may be diagnosed during testing for GERD. Tests that may be done include:  X-rays of your stomach or chest.  An upper gastrointestinal (GI) series. This is an X-ray exam of your GI tract that is taken after you swallow a chalky liquid that shows up clearly on the X-ray.  Endoscopy. This is a procedure to look into your stomach using a thin, flexible tube that has a tiny camera and light on the end of it.  How is this treated? This condition  may be treated by:  Dietary and lifestyle changes to help reduce GERD symptoms.  Medicines. These may include: ? Over-the-counter antacids. ? Medicines that make your stomach empty more quickly. ? Medicines that block the production of stomach acid (H2 blockers). ? Stronger medicines to reduce stomach acid (proton pump inhibitors).  Surgery to repair the hernia, if other  treatments are not helping.  If you have no symptoms, you may not need treatment. Follow these instructions at home: Lifestyle and activity  Do not use any products that contain nicotine or tobacco, such as cigarettes and e-cigarettes. If you need help quitting, ask your health care provider.  Try to achieve and maintain a healthy body weight.  Avoid putting pressure on your abdomen. Anything that puts pressure on your abdomen increases the amount of acid that may be pushed up into your esophagus. ? Avoid bending over, especially after eating. ? Raise the head of your bed by putting blocks under the legs. This keeps your head and esophagus higher than your stomach. ? Do not wear tight clothing around your chest or stomach. ? Try not to strain when having a bowel movement, when urinating, or when lifting heavy objects. Eating and drinking  Avoid foods that can worsen GERD symptoms. These may include: ? Fatty foods, like fried foods. ? Citrus fruits, like oranges or lemon. ? Other foods and drinks that contain acid, like orange juice or tomatoes. ? Spicy food. ? Chocolate.  Eat frequent small meals instead of three large meals a day. This helps prevent your stomach from getting too full. ? Eat slowly. ? Do not lie down right after eating. ? Do not eat 1-2 hours before bed.  Do not drink beverages with caffeine. These include cola, coffee, cocoa, and tea.  Do not drink alcohol. General instructions  Take over-the-counter and prescription medicines only as told by your health care provider.  Keep all  follow-up visits as told by your health care provider. This is important. Contact a health care provider if:  Your symptoms are not controlled with medicines or lifestyle changes.  You are having trouble swallowing.  You have coughing or wheezing that will not go away. Get help right away if:  Your pain is getting worse.  Your pain spreads to your arms, neck, jaw, teeth, or back.  You have shortness of breath.  You sweat for no reason.  You feel sick to your stomach (nauseous) or you vomit.  You vomit blood.  You have bright red blood in your stools.  You have black, tarry stools. This information is not intended to replace advice given to you by your health care provider. Make sure you discuss any questions you have with your health care provider. Document Released: 01/15/2004 Document Revised: 10/18/2016 Document Reviewed: 10/18/2016 Elsevier Interactive Patient Education  2018 [place] and colon polyp information provided  Course of Anusol cream to the anorectum 3 times a day  Further recommendations to follow pending review of pathology report  Office visit with [place] in 3 months

## 2018-02-20 NOTE — Op Note (Signed)
[facility] Patient Name: Richard Flores Procedure Date: 02/20/2018 1:44 PM MRN: [id number] Date of Birth: 22-Feb-1981 Attending MD: Norvel Richards , MD CSN: [id number] Age: 37 Admit Type: Outpatient Procedure:                Colonoscopy Indications:              Hematochezia Providers:                Norvel Richards, MD, Janeece Riggers, RN, Lurline Del, RN Referring MD:              Medicines:                Propofol per Anesthesia Complications:            No immediate complications. Estimated Blood Loss:     Estimated blood loss was minimal. Procedure:                Pre-Anesthesia Assessment:                           - Prior to the procedure, a History and Physical                            was performed, and patient medications and                            allergies were reviewed. The patient's tolerance of                            previous anesthesia was also reviewed. The risks                            and benefits of the procedure and the sedation                            options and risks were discussed with the patient.                            All questions were answered, and informed consent                            was obtained. Prior Anticoagulants: The patient has                            taken no previous anticoagulant or antiplatelet                            agents. ASA Grade Assessment: II - A patient with                            mild systemic disease. After reviewing the risks  and benefits, the patient was deemed in                            satisfactory condition to undergo the procedure.                           After obtaining informed consent, the colonoscope                            was passed under direct vision. Throughout the                            procedure, the patient's blood pressure, pulse, and                            oxygen saturations were monitored  continuously. The                            EC-[id number] ([id number]) scope was introduced through                            the and advanced to the 5 cm into the ileum. The                            colonoscopy was performed without difficulty. The                            patient tolerated the procedure well. The quality                            of the bowel preparation was adequate. The                            ileocecal valve, appendiceal orifice, and rectum                            were photographed. The colonoscopy was performed                            without difficulty. The patient tolerated the                            procedure well. The entire colon was well                            visualized. Scope In: 1:48:57 PM Scope Out: 2:01:11 PM Scope Withdrawal Time: 0 hours 8 minutes 26 seconds  Total Procedure Duration: 0 hours 12 minutes 14 seconds  Findings:      The perianal and digital rectal examinations were normal.      Non-bleeding internal hemorrhoids were found during endoscopy. The       hemorrhoids were moderate, medium-sized and Grade I (internal       hemorrhoids that do not prolapse).      A 4 mm polyp was found in the  cecum. The polyp was sessile. The polyp       was removed with a cold snare. Resection and retrieval were complete.       Estimated blood loss was minimal.      The exam was otherwise without abnormality on direct and retroflexion       views. Impression:               - Non-bleeding internal hemorrhoids.                           - One 4 mm polyp in the cecum, removed with a cold                            snare. Resected and retrieved.                           - The examination was otherwise normal on direct                            and retroflexion views. Moderate Sedation:      Moderate (conscious) sedation was personally administered by an       anesthesia professional. The following parameters were monitored: oxygen        saturation, heart rate, blood pressure, respiratory rate, EKG, adequacy       of pulmonary ventilation, and response to care. Total physician       intraservice time was 34 minutes. Recommendation:           - Patient has a contact number available for                            emergencies. The signs and symptoms of potential                            delayed complications were discussed with the                            patient. Return to normal activities tomorrow.                            Written discharge instructions were provided to the                            patient.                           - Resume previous diet.                           - Continue present medications.                           - Repeat colonoscopy date to be determined after                            pending pathology results are reviewed for  surveillance based on pathology results.                           - Return to GI office in 3 months. Course of Anusol                            HC cream to the anorectum 3 times a day. See EGD                            report. Procedure Code(s):        --- Professional ---                           [phone], Colonoscopy, flexible; with removal of                            tumor(s), polyp(s), or other lesion(s) by snare                            technique Diagnosis Code(s):        --- Professional ---                           D12.0, Benign neoplasm of cecum                           K64.0, First degree hemorrhoids                           K92.1, Melena (includes Hematochezia) CPT copyright 2017 American Medical Association. All rights reserved. The codes documented in this report are preliminary and upon coder review may  be revised to meet current compliance requirements. Cristopher Estimable. Bemnet Trovato, MD Norvel Richards, MD 02/20/2018 2:09:06 PM This report has been signed electronically. Number of Addenda: 0

## 2018-02-20 NOTE — Anesthesia Postprocedure Evaluation (Signed)
Anesthesia Post Note  Patient: Richard Flores  Procedure(s) Performed: COLONOSCOPY WITH PROPOFOL (N/A ) ESOPHAGOGASTRODUODENOSCOPY (EGD) WITH PROPOFOL (N/A ) BIOPSY POLYPECTOMY  Patient location during evaluation: PACU Anesthesia Type: General Level of consciousness: awake and alert and oriented Pain management: pain level controlled Vital Signs Assessment: post-procedure vital signs reviewed and stable Respiratory status: spontaneous breathing Cardiovascular status: stable Postop Assessment: no apparent nausea or vomiting Anesthetic complications: no     Last Vitals:  Vitals:   02/20/18 1114 02/20/18 1415  BP: 127/85 102/60  Pulse: 81 81  Resp: 18 18  Temp: 37.1 C 36.6 C  SpO2: 100% 97%    Last Pain:  Vitals:   02/20/18 1415  TempSrc:   PainSc: 0-No pain                 Amran Malter

## 2018-02-20 NOTE — H&P (Signed)
'@LOGO' @   Primary Care Physician:  Dorothy Puffer, FNP Primary Gastroenterologist:  Dr. Gala Romney  Pre-Procedure History & Physical: HPI:  Richard Flores is a 37 y.o. male here for peripheral evaluation nausea vomiting GERD and intermittent esophageal dysphagia, diarrhea and rectal bleeding.  Past Medical History:  Diagnosis Date  . Anxiety   . Arthritis   . Depression   . GERD (gastroesophageal reflux disease)     Past Surgical History:  Procedure Laterality Date  . HAND SURGERY Right 2008   repair of knuckles, screws.    Prior to Admission medications   Medication Sig Start Date End Date Taking? Authorizing Provider  Aspirin-Salicylamide-Caffeine (BC FAST PAIN RELIEF) 650-195-33.3 MG PACK Take 1 packet by mouth 2 (two) times daily as needed (for pain/headaches.).   Yes [provider]  methylPREDNISolone (MEDROL DOSEPAK) 4 MG TBPK tablet Take by mouth.   Yes [provider]  pantoprazole (PROTONIX) 40 MG tablet Take 1 tablet (40 mg total) by mouth daily before breakfast. Patient taking differently: Take 40 mg by mouth daily as needed (for heartburn/indigestion.).  01/20/18  Yes Mahala Menghini, PA-C  Polyethylene Glycol 400 (BLINK TEARS) 0.25 % SOLN Place 1-2 drops into both eyes 3 (three) times daily as needed (for dry eyes.).   Yes [provider]  ibuprofen (ADVIL,MOTRIN) 800 MG tablet Take 1 tablet (800 mg total) by mouth 3 (three) times daily. Patient not taking: Reported on 02/13/2018 12/27/17   Evalee Jefferson, PA-C  Na Sulfate-K Sulfate-Mg Sulf 17.5-3.13-1.6 GM/177ML SOLN Take 1 kit by mouth as directed. 01/20/18   Daneil Dolin, MD    Allergies as of 01/20/2018  . (No Known Allergies)    Family History  Problem Relation Age of Onset  . Irritable bowel syndrome Mother   . Colon cancer Neg Hx   . Inflammatory bowel disease Neg Hx   . Celiac disease Neg Hx     Social History   Socioeconomic History  . Marital status: Single    Spouse  name: Not on file  . Number of children: Not on file  . Years of education: Not on file  . Highest education level: Not on file  Occupational History  . Not on file  Social Needs  . Financial resource strain: Not on file  . Food insecurity:    Worry: Not on file    Inability: Not on file  . Transportation needs:    Medical: Not on file    Non-medical: Not on file  Tobacco Use  . Smoking status: Never Smoker  . Smokeless tobacco: Current User    Types: Chew  Substance and Sexual Activity  . Alcohol use: No    Frequency: Never  . Drug use: No  . Sexual activity: Yes    Birth control/protection: None  Lifestyle  . Physical activity:    Days per week: Not on file    Minutes per session: Not on file  . Stress: Not on file  Relationships  . Social connections:    Talks on phone: Not on file    Gets together: Not on file    Attends religious service: Not on file    Active member of club or organization: Not on file    Attends meetings of clubs or organizations: Not on file    Relationship status: Not on file  . Intimate partner violence:    Fear of current or ex partner: Not on file    Emotionally abused: Not on  file    Physically abused: Not on file    Forced sexual activity: Not on file  Other Topics Concern  . Not on file  Social History Narrative  . Not on file    Review of Systems: See HPI, otherwise negative ROS  Physical Exam: BP 127/85   Pulse 81   Temp 98.8 F (37.1 C) (Oral)   Resp 18   SpO2 100%  General:   Alert,  Well-developed, well-nourished, pleasant and cooperative in NAD Mouth:  No deformity or lesions. Neck:  Supple; no masses or thyromegaly. No significant cervical adenopathy. Lungs:  Clear throughout to auscultation.   No wheezes, crackles, or rhonchi. No acute distress. Heart:  Regular rate and rhythm; no murmurs, clicks, rubs,  or gallops. Abdomen: Non-distended, normal bowel sounds.  Soft and nontender without appreciable mass or  hepatosplenomegaly.  Pulses:  Normal pulses noted. Extremities:  Without clubbing or edema.  Impression:  37 year old gentleman with GERD dysphagia nausea vomiting, chronic diarrhea and rectal bleeding. Celiac serologies never done.  Recommendations: I have offered the patient both an EGD with esophageal dilation and diagnostic colonoscopy per plan. The risks, benefits, limitations, imponderables and alternatives regarding both EGD and colonoscopy have been reviewed with the patient. Questions have been answered. All parties agreeable.      Notice: This dictation was prepared with Dragon dictation along with smaller phrase technology. Any transcriptional errors that result from this process are unintentional and may not be corrected upon review.

## 2018-02-20 NOTE — Op Note (Signed)
[facility] Patient Name: Richard Flores Procedure Date: 02/20/2018 1:06 PM MRN: [id number] Date of Birth: Jul 23, 1981 Attending MD: Norvel Richards , MD CSN: [id number] Age: 36 Admit Type: Outpatient Procedure:                Upper GI endoscopy Indications:              Epigastric abdominal pain, Nausea with vomiting Providers:                Norvel Richards, MD, Janeece Riggers, RN, Lurline Del, RN Referring MD:             Burman Freestone FNP, NP Medicines:                Propofol per Anesthesia Complications:            No immediate complications. Estimated Blood Loss:     Estimated blood loss was minimal. Procedure:                Pre-Anesthesia Assessment:                           - Prior to the procedure, a History and Physical                            was performed, and patient medications and                            allergies were reviewed. The patient's tolerance of                            previous anesthesia was also reviewed. The risks                            and benefits of the procedure and the sedation                            options and risks were discussed with the patient.                            All questions were answered, and informed consent                            was obtained. Prior Anticoagulants: The patient has                            taken no previous anticoagulant or antiplatelet                            agents. ASA Grade Assessment: II - A patient with                            mild systemic disease. After reviewing the risks  and benefits, the patient was deemed in                            satisfactory condition to undergo the procedure.                           After obtaining informed consent, the endoscope was                            passed under direct vision. Throughout the                            procedure, the patient's blood pressure, pulse, and                         oxygen saturations were monitored continuously. The                            EG-[id number] ([id number]) scope was introduced through the                            and advanced to the second part of duodenum. The                            upper GI endoscopy was accomplished without                            difficulty. The patient tolerated the procedure                            well. Scope In: 1:32:59 PM Scope Out: 1:41:43 PM Total Procedure Duration: 0 hours 8 minutes 44 seconds  Findings:      The examined esophagus was normal.      A small hiatal hernia was present.      Multiple erosions were found in the gastric antrum. This was biopsied       with a cold forceps for histology. Estimated blood loss was minimal.      Multiple erosions were found in the duodenal bulb. This was biopsied       with a cold forceps for histology. Estimated blood loss was minimal. The       scope was withdrawn. Dilation was performed with a Maloney dilator with       mild resistance at 56 Fr. The dilation site was examined following       endoscope reinsertion and showed no change. Estimated blood loss: none. Impression:               - Normal esophagus. Dilated.                           - Small hiatal hernia.                           - Erosive gastropathy. Biopsied.                           - Duodenal erosions. Biopsied.  Moderate Sedation:      Moderate (conscious) sedation was personally administered by an       anesthesia professional. The following parameters were monitored: oxygen       saturation, heart rate, blood pressure, respiratory rate, EKG, adequacy       of pulmonary ventilation, and response to care. Total physician       intraservice time was 16 minutes. Recommendation:           - Patient has a contact number available for                            emergencies. The signs and symptoms of potential                            delayed complications were discussed  with the                            patient. Return to normal activities tomorrow.                            Written discharge instructions were provided to the                            patient.                           - Resume previous diet.                           - Continue present medications.                           - No repeat upper endoscopy.                           - Return to GI office in 3 months. See colonoscopy                            report. Procedure Code(s):        --- Professional ---                           [phone], Esophagogastroduodenoscopy, flexible,                            transoral; with biopsy, single or multiple                           43450, Dilation of esophagus, by unguided sound or                            bougie, single or multiple passes Diagnosis Code(s):        --- Professional ---                           K44.9, Diaphragmatic hernia without obstruction or  gangrene                           K31.89, Other diseases of stomach and duodenum                           K26.9, Duodenal ulcer, unspecified as acute or                            chronic, without hemorrhage or perforation                           R10.13, Epigastric pain                           R11.2, Nausea with vomiting, unspecified CPT copyright 2017 American Medical Association. All rights reserved. The codes documented in this report are preliminary and upon coder review may  be revised to meet current compliance requirements. Cristopher Estimable. Roshunda Keir, MD Norvel Richards, MD 02/20/2018 2:05:23 PM This report has been signed electronically. Number of Addenda: 0

## 2018-02-21 NOTE — Addendum Note (Signed)
Addendum  created 02/21/18 1354 by Vista Deck, CRNA   Charge Capture section accepted

## 2018-02-25 ENCOUNTER — Encounter: Payer: Self-pay | Admitting: Internal Medicine

## 2018-02-27 ENCOUNTER — Encounter (HOSPITAL_COMMUNITY): Payer: Self-pay | Admitting: Internal Medicine

## 2018-05-22 ENCOUNTER — Ambulatory Visit: Payer: Medicaid - Out of State | Admitting: Gastroenterology

## 2018-05-22 ENCOUNTER — Encounter: Payer: Self-pay | Admitting: Gastroenterology
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
## 2019-10-17 ENCOUNTER — Encounter: Payer: Self-pay | Admitting: Internal Medicine

## 2019-10-17 ENCOUNTER — Ambulatory Visit: Payer: Medicaid - Out of State | Admitting: Nurse Practitioner

## 2019-10-17 ENCOUNTER — Telehealth: Payer: Self-pay | Admitting: Internal Medicine

## 2019-10-17 NOTE — Telephone Encounter (Signed)
PATIENT WAS A NO SHOW AND LETTER SENT  °

## 2019-10-17 NOTE — Progress Notes (Deleted)
Referring Provider: Dorothy Puffer, FNP Primary Care Physician:  Dorothy Puffer, FNP Primary GI:  Dr.   Rayne Du chief complaint on file.   HPI:   Richard Flores is a 38 y.o. male who presents for follow-up.  Patient was last seen in our office 01/20/2018 for chronic diarrhea, rectal bleeding, abnormal weight loss, hematemesis, lower abdominal pain.  At his last visit patient noted diarrhea for years to loose stool every time he eats, passes fresh blood, dark blood, and once blood passes then he passes no melena.  No rectal pain.  Intermittent nausea and vomiting.  Has had some hematemesis.  Subjective 25 pound weight loss in the past year.  States he is eating plenty.  Has anxiety and depression, previously on antidepressants and stopped these on his own due to antibiotic management feeling. Lives on [address].  Abdominal pain in the epigastric region, cannot take good deep breath.  Denies alcohol or drug use.  Noted recent CT imaging reassuring.  Previous records requested.  Due to abdominal pain and weight loss recommended colonoscopy and upper endoscopy on deep sedation.  Recommended celiac serologies, start Protonix 40 mg daily.  Colonoscopy completed 02/20/2018 which found nonbleeding internal hemorrhoids, a single 4 mm polyp in the cecum, otherwise normal.  Surgical pathology found the polyp to be polypoid colonic mucosa with lymphoid aggregate.  Recommended next colonoscopy at age 62 for colorectal cancer screening.  EGD the same day found normal esophagus status post dilation, small hiatal hernia, erosive gastropathy status post biopsy, duodenal erosions status post biopsy.  Surgical pathology found the gastric biopsies to be gastric oxyntic mucosa without specific histopathological changes and the duodenal biopsies to be duodenal mucosa without specific histopathological changes.  Recommended continue current medications and follow-up in 3 months.  Today he states   Past Medical  History:  Diagnosis Date  . Anxiety   . Arthritis   . Depression   . GERD (gastroesophageal reflux disease)     Past Surgical History:  Procedure Laterality Date  . BIOPSY  02/20/2018   Procedure: BIOPSY;  Surgeon: Daneil Dolin, MD;  Location: [facility];  Service: Endoscopy;;  duodenum,gastric  . COLONOSCOPY WITH PROPOFOL N/A 02/20/2018   Procedure: COLONOSCOPY WITH PROPOFOL;  Surgeon: Daneil Dolin, MD;  Location: [facility];  Service: Endoscopy;  Laterality: N/A;  12:00pm  . ESOPHAGOGASTRODUODENOSCOPY (EGD) WITH PROPOFOL N/A 02/20/2018   Procedure: ESOPHAGOGASTRODUODENOSCOPY (EGD) WITH PROPOFOL;  Surgeon: Daneil Dolin, MD;  Location: [facility];  Service: Endoscopy;  Laterality: N/A;  . HAND SURGERY Right 2008   repair of knuckles, screws.  Marland Kitchen POLYPECTOMY  02/20/2018   Procedure: POLYPECTOMY;  Surgeon: Daneil Dolin, MD;  Location: [facility];  Service: Endoscopy;;  cecal,    Current Outpatient Medications  Medication Sig Dispense Refill  . Aspirin-Salicylamide-Caffeine (BC FAST PAIN RELIEF) 650-195-33.3 MG PACK Take 1 packet by mouth 2 (two) times daily as needed (for pain/headaches.).    Marland Kitchen methylPREDNISolone (MEDROL DOSEPAK) 4 MG TBPK tablet Take by mouth.    . Na Sulfate-K Sulfate-Mg Sulf 17.5-3.13-1.6 GM/177ML SOLN Take 1 kit by mouth as directed. 1 Bottle 0  . pantoprazole (PROTONIX) 40 MG tablet Take 1 tablet (40 mg total) by mouth daily before breakfast. (Patient taking differently: Take 40 mg by mouth daily as needed (for heartburn/indigestion.). ) 30 tablet 5  . Polyethylene Glycol 400 (BLINK TEARS) 0.25 % SOLN Place 1-2 drops into both eyes 3 (three) times daily as  needed (for dry eyes.).     No current facility-administered medications for this visit.     Allergies as of 10/17/2019  . (No Known Allergies)    Family History  Problem Relation Age of Onset  . Irritable bowel syndrome Mother   . Colon cancer Neg Hx   . Inflammatory bowel disease Neg  Hx   . Celiac disease Neg Hx     Social History   Socioeconomic History  . Marital status: Single    Spouse name: Not on file  . Number of children: Not on file  . Years of education: Not on file  . Highest education level: Not on file  Occupational History  . Not on file  Social Needs  . Financial resource strain: Not on file  . Food insecurity    Worry: Not on file    Inability: Not on file  . Transportation needs    Medical: Not on file    Non-medical: Not on file  Tobacco Use  . Smoking status: Never Smoker  . Smokeless tobacco: Current User    Types: Chew  Substance and Sexual Activity  . Alcohol use: No    Frequency: Never  . Drug use: No  . Sexual activity: Yes    Birth control/protection: None  Lifestyle  . Physical activity    Days per week: Not on file    Minutes per session: Not on file  . Stress: Not on file  Relationships  . Social Herbalist on phone: Not on file    Gets together: Not on file    Attends religious service: Not on file    Active member of club or organization: Not on file    Attends meetings of clubs or organizations: Not on file    Relationship status: Not on file  Other Topics Concern  . Not on file  Social History Narrative  . Not on file    Review of Systems: General: Negative for anorexia, weight loss, fever, chills, fatigue, weakness. Eyes: Negative for vision changes.  ENT: Negative for hoarseness, difficulty swallowing , nasal congestion. CV: Negative for chest pain, angina, palpitations, dyspnea on exertion, peripheral edema.  Respiratory: Negative for dyspnea at rest, dyspnea on exertion, cough, sputum, wheezing.  GI: See history of present illness. GU:  Negative for dysuria, hematuria, urinary incontinence, urinary frequency, nocturnal urination.  MS: Negative for joint pain, low back pain.  Derm: Negative for rash or itching.  Neuro: Negative for weakness, abnormal sensation, seizure, frequent headaches,  memory loss, confusion.  Psych: Negative for anxiety, depression, suicidal ideation, hallucinations.  Endo: Negative for unusual weight change.  Heme: Negative for bruising or bleeding. Allergy: Negative for rash or hives.   Physical Exam: There were no vitals taken for this visit. General:   Alert and oriented. Pleasant and cooperative. Well-nourished and well-developed.  Head:  Normocephalic and atraumatic. Eyes:  Without icterus, sclera clear and conjunctiva pink.  Ears:  Normal auditory acuity. Mouth:  No deformity or lesions, oral mucosa pink.  Throat/Neck:  Supple, without mass or thyromegaly. Cardiovascular:  S1, S2 present without murmurs appreciated. Normal pulses noted. Extremities without clubbing or edema. Respiratory:  Clear to auscultation bilaterally. No wheezes, rales, or rhonchi. No distress.  Gastrointestinal:  +BS, soft, non-tender and non-distended. No HSM noted. No guarding or rebound. No masses appreciated.  Rectal:  Deferred  Musculoskalatal:  Symmetrical without gross deformities. Normal posture. Skin:  Intact without significant lesions or rashes. Neurologic:  Alert  and oriented x4;  grossly normal neurologically. Psych:  Alert and cooperative. Normal mood and affect. Heme/Lymph/Immune: No significant cervical adenopathy. No excessive bruising noted.    10/17/2019 1:29 PM   Disclaimer: This note was dictated with voice recognition software. Similar sounding words can inadvertently be transcribed and may not be corrected upon review.

## 2020-07-15 ENCOUNTER — Encounter: Payer: Self-pay | Admitting: Internal Medicine

## 2020-07-18 ENCOUNTER — Ambulatory Visit: Payer: Medicaid - Out of State | Admitting: Gastroenterology

## 2020-09-01 NOTE — Progress Notes (Deleted)
History of chronic diarrhea.    2019 Non-bleeding internal hemorrhoids. 4 mm polyp in cecum. Lymphoid aggregrate.   Esophagus: normal. Small hiatal hernia. Multiple erosions in gastric antrum s/p biopsy. Multiple erosions in duodenal bulb. Empiric dilation. Negative H.pylori. Negative celiac.

## 2020-09-02 ENCOUNTER — Ambulatory Visit: Payer: Medicaid - Out of State | Admitting: Gastroenterology
